# Patient Record
Sex: Female | Born: 1955 | Race: White | Hispanic: No | Marital: Married
Health system: Southern US, Community
[De-identification: ages and names within clinical notes are randomized; demographics above are authoritative.]

---

## 2013-08-19 ENCOUNTER — Observation Stay: Payer: Self-pay | Admitting: Internal Medicine

## 2013-08-19 LAB — URINALYSIS, COMPLETE
Bacteria: NEGATIVE
Blood: NEGATIVE
Glucose,UR: 1000 mg/dL (ref 0–75)
Ketone: NEGATIVE
Leukocyte Esterase: NEGATIVE
Ph: 5 (ref 4.5–8.0)
Protein: NEGATIVE
RBC,UR: NONE SEEN /HPF (ref 0–5)

## 2013-08-19 LAB — COMPREHENSIVE METABOLIC PANEL
Alkaline Phosphatase: 115 U/L (ref 50–136)
BUN: 15 mg/dL (ref 7–18)
Bilirubin,Total: 0.4 mg/dL (ref 0.2–1.0)
Calcium, Total: 9.8 mg/dL (ref 8.5–10.1)
Chloride: 104 mmol/L (ref 98–107)
Co2: 28 mmol/L (ref 21–32)
EGFR (Non-African Amer.): >60
Osmolality: 277 (ref 275–301)
Potassium: 4.3 mmol/L (ref 3.5–5.1)

## 2013-08-19 LAB — CBC
HCT: 42.1 % (ref 35.0–47.0)
HGB: 14.1 g/dL (ref 12.0–16.0)
MCH: 26.8 pg (ref 26.0–34.0)
RBC: 5.23 10*6/uL — ABNORMAL HIGH (ref 3.80–5.20)
RDW: 14.8 % — ABNORMAL HIGH (ref 11.5–14.5)
WBC: 7 10*3/uL (ref 3.6–11.0)

## 2013-08-19 LAB — TROPONIN I: Troponin-I: <0.02 ng/mL

## 2015-03-08 NOTE — Discharge Summary (Signed)
PATIENT NAME:  Sharon FramesCROSS, Nneka MR#:  [id number] DATE OF BIRTH:  Nov 28, 1955  DATE OF ADMISSION:  08/19/2013 DATE OF DISCHARGE:  08/19/2013   ADMISSION DIAGNOSIS: Vertigo.   DISCHARGE DIAGNOSES:  1. Vertigo, likely benign paroxysmal positional vertigo.  2. History of diabetes. 3. History of hyperlipidemia.   CONSULTATIONS: None.   IMAGING:  1. CT of the head showed no acute intracranial hemorrhage or CVA.  2. MRI of the brain showed no acute CVA.  3. Ultrasound of carotids showed no hemodynamically significant stenosis.   HOSPITAL COURSE: This is a 59 year old female who is visiting [facility], who came into the ER with vertigo. For further details, please refer to Dr. Rob HickmanGouru's H and P.   1. Vertigo. The patient was admitted to rule out a TIA. Her CT and MRI of the head were both negative. She was placed on meclizine, which should help with the vertigo. She did not need an echocardiogram as this was not related to a CVA. Her carotid Dopplers showed no hemodynamic significant stenosis.  2. The patient will continue with meclizine, and PT consultation was obtained.  3. Diabetes. The patient will continue her outpatient medications.  4. Hyperlipidemia, on Zocor.   DISCHARGE MEDICATIONS:  1. Metformin 1000 mg b.i.d.  2. Cymbalta 60 mg daily.  3. Cymbalta 30 mg at bedtime.  4. Zocor 40 mg at bedtime.  5. Zegerid daily.  6. Allergy medicine daily.  7. Meclizine 25 mg t.i.d. p.r.n. vertigo.   DISCHARGE DIET: ADA diet.   DISCHARGE ACTIVITY: As tolerated.   DISCHARGE FOLLOWUP: The patient can follow up in [place] with her primary care physician.   TIME SPENT: 35 minutes.   The patient is medically stable for discharge.   ____________________________ Margerie Fraiser P. Juliene PinaMody, MD spm:OSi D: 08/19/2013 12:36:34 ET T: 08/19/2013 13:26:24 ET JOB#: [id number]  cc: Cosmo Tetreault P. Juliene PinaMody, MD, <Dictator> Janyth ContesSITAL P Alzena Gerber MD ELECTRONICALLY SIGNED 08/19/2013 21:49

## 2015-03-08 NOTE — H&P (Signed)
PATIENT NAME:  Sharon FramesCROSS, Verlee MR#:  [id number] DATE OF BIRTH:  1956-04-12  DATE OF ADMISSION:  08/19/2013  PRIMARY CARE PHYSICIAN: Nonlocal.   REFERRING PHYSICIAN: Darien Ramusavid W. Kaminski, MD  CHIEF COMPLAINT: Right-sided weakness with headache and vertigo.   HISTORY OF PRESENT ILLNESS: The patient is a 59 year old Caucasian female with past medical history of TIA, diabetes mellitus and hyperlipidemia, who is presenting to the ER with a chief complaint of room spinning around her associated with headache and right-sided weakness. The patient is reporting that she fell asleep last night, and at around 2:00 a.m., she woke up from sleep because of vertigo. The patient felt like the room was spinning around her, associated with headache. Also, she has developed right-sided weakness. Denies any difficulty with swallowing or speech. She had a history of TIA in the past in April 2014. At that time, she does not remember getting any MRI of the brain. Here in the ER, CAT scan of the head is negative for any acute findings. The patient was given Phenergan by the ER physician, which temporarily helped with vertigo, but again, she started developing the room spinning around her sensation. She denies any similar complaints in the past. She also is complaining of decreased touch sensation on the right-hand side. Husband is at bedside. Denies any chest pain, shortness of breath. The patient used to have a history of hypertension, but as her blood pressures were running on the lower side, her blood pressure medications were discontinued.   PAST MEDICAL HISTORY: TIA, diabetes mellitus, hyperlipidemia, GERD, seasonal allergies.   PAST SURGICAL HISTORY: Hysterectomy.   ALLERGIES: No known drug allergies.   PSYCHOSOCIAL HISTORY: Lives at home with husband. She used to smoke, but quit smoking. Occasional drinking of alcohol. Denies any illicit drug usage.   FAMILY HISTORY: Both mom and dad have history of diabetes mellitus,  and mom had a history of stroke.   REVIEW OF SYSTEMS:  CONSTITUTIONAL: Denies any fever, fatigue. No weight loss or weight gain.  EYES: Denies any blurry vision or glaucoma.  ENT: Denies epistaxis or discharge.  Respiration: Denies cough, COPD.  CARDIOVASCULAR: Denies any chest pain or palpitations.  GASTROINTESTINAL: Denies nausea, vomiting, diarrhea.  GENITOURINARY: No dysuria or hematuria.  GYNECOLOGIC AND BREASTS: Denies breast mass or vaginal discharge.  ENDOCRINE: Denies polyuria, nocturia, thyroid problems.  HEMATOLOGIC AND LYMPHATIC: Denies anemia, easy bruising, bleeding.  INTEGUMENTARY: No acne, rash, lesions.  MUSCULOSKELETAL: Denies any joint pain in the neck, back, shoulder. NEUROLOGIC: Has a past history of TIA. Denies any numbness or epilepsy.  PSYCHIATRIC: Denies any ADD, OCD.   PHYSICAL EXAMINATION:  VITAL SIGNS: Temperature at 97.4, pulse 76, respirations 16, blood pressure 120/60, pulse oximetry 100% on room air.  GENERAL APPEARANCE: Not under acute distress. Moderately built and nourished.  HEENT: Normocephalic, atraumatic. Pupils are equally reacting to light and accommodation. No scleral icterus. No conjunctival injection. No sinus tenderness. No postnasal drip. Moist mucous membranes.  NECK: Supple. No JVD. No thyromegaly. Range of motion of the neck is intact. No meningeal signs.  LUNGS: Clear to auscultation bilaterally. No accessory muscle usage. No anterior chest wall tenderness on palpation.  CARDIAC: S1, S2 normal. Regular rate and rhythm. No murmurs.  GASTROINTESTINAL: Soft. Bowel sounds are positive in all 4 quadrants. Nontender, nondistended. No hepatosplenomegaly.  NEUROLOGIC: Awake, alert, oriented x3. Cranial nerves II through XII are grossly intact. Sensory: Decreased touch sensation on the right upper and lower extremity as well as on the right side of the  face. Motor: Right upper extremity and lower extremity strength is 4 out of 5. Left upper extremity  and lower extremity motor and sensory are intact. Reflexes are 2+. Gait is not evaluated as the patient is complaining of severe vertigo and uncomfortable getting out of the bed.  EXTREMITIES: No edema. No cyanosis. No clubbing.  SKIN: Warm to touch. Normal turgor. No rashes. No lesions.  VASCULAR: Good dorsalis pedis and posterior tibial pulses.  PSYCHIATRIC: Normal mood and affect.   LABORATORY AND IMAGING STUDIES: A 12-lead EKG: Normal sinus rhythm. Normal PR and QRS intervals. CAT scan of the head: Normal noncontrast scan of the brain. LFTs are normal except total protein which is elevated at 8.3. CBC is normal. Urinalysis: Straw-colored, clear in appearance, glucose 1000 mg/dL, ketones negative, nitrite negative, leukocyte esterase negative. BMP is normal except for glucose which is at 132 and anion gap low at 5.   ASSESSMENT AND PLAN: A 59 year old Caucasian female with past medical history of transient ischemic attack, who is presenting to the ER with a chief complaint of vertigo and right-sided weakness. Will be admitted with the following assessment and plan:   1. Transient ischemic attack versus stroke with right-sided weakness.  Will admit her to telemetry under observation status with stroke workup, with MRI of the brain, carotid Dopplers and 2-D echocardiogram. Will provide her aspirin 325 mg. Continue home medication, statin.  PT evaluation for dizziness and possible head tilt test regarding vertigo.  2. Vertigo, probably benign paroxysmal positional vertigo. Differential diagnosis: Labyrinthitis versus Meniere's disease. Will provide her meclizine p.r.n. Outpatient followup with ENT is recommended if there is no improvement in vertigo.  3. Diabetes mellitus. The patient will be on insulin sliding scale and resume metformin.  4. Hyperlipidemia. Continue statin and check fasting lipid panel.  5. Past history of transient ischemic attack. The patient will be on aspirin 325 mg at this  time. 6. The patient will be provided with gastrointestinal and deep vein thrombosis prophylaxis.   Diagnosis and plan of care were discussed in detail with the patient. She is aware of the plan.   CODE STATUS: She is full code. Husband is the medical power of attorney.   TOTAL TIME SPENT ON ADMISSION: 45 minutes.   ____________________________ Ramonita Lab, MD ag:OSi D: 08/19/2013 07:32:44 ET T: 08/19/2013 08:00:55 ET JOB#: [id number]  cc: Ramonita Lab, MD, <Dictator> Ramonita Lab MD ELECTRONICALLY SIGNED 08/21/2013 1:02

## 2015-05-23 IMAGING — CT CT HEAD WITHOUT CONTRAST
2 series · 16 of 30 positions shown, 20 images · non-contrast
Comparison: none

REASON FOR EXAM: dizziness, weakness, altered
COMMENTS:   May transport without cardiac monitor

[Series 2: without · axial · non-contrast · 0.40mm/px · z∈[-126,-1]mm · 13 of 31 slices shown, 17 images]
[im 3/31  brain]
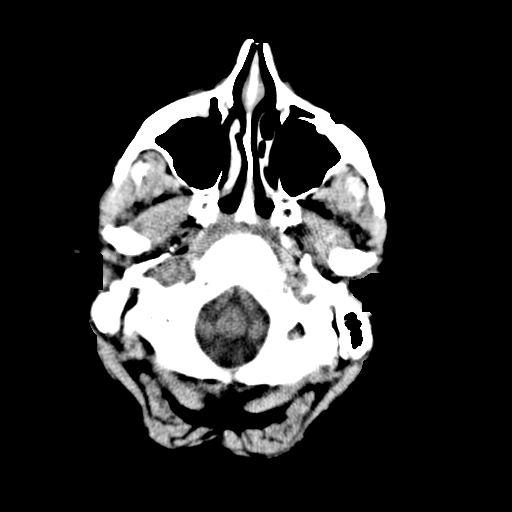
[im 3/31  bone]
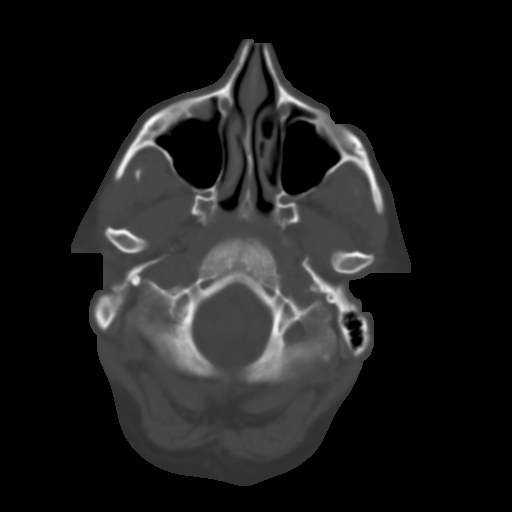
[im 5/31  brain]
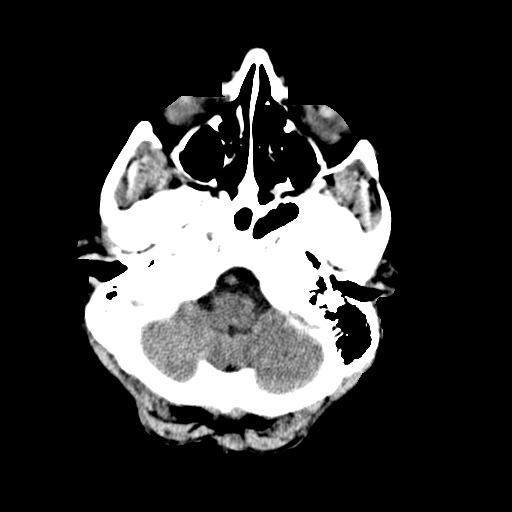
[im 7/31  brain]
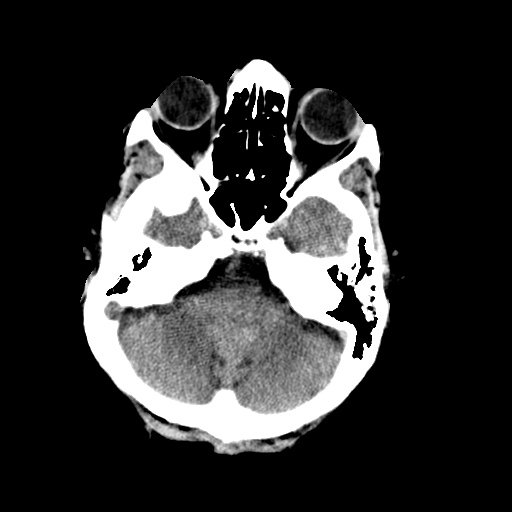
[im 9/31  brain]
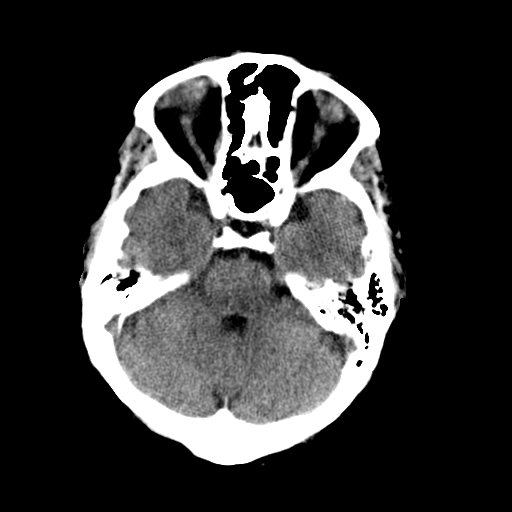
[im 11/31  brain]
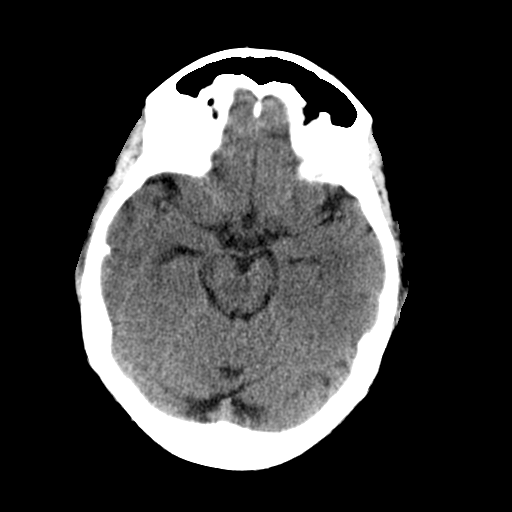
[im 11/31  bone]
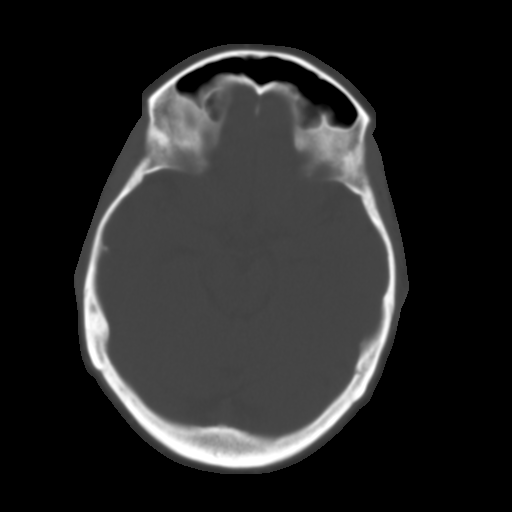
[im 13/31  brain]
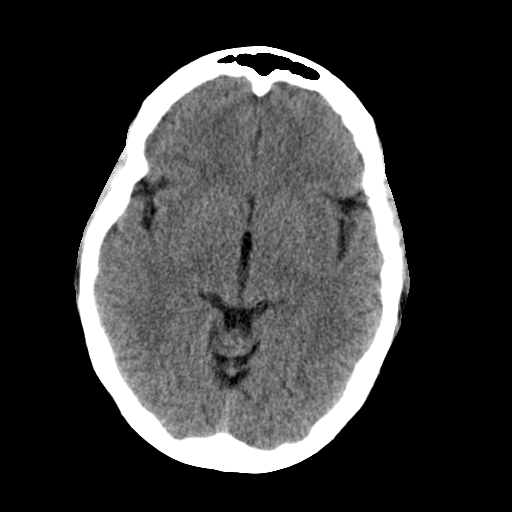
[im 16/31  brain]
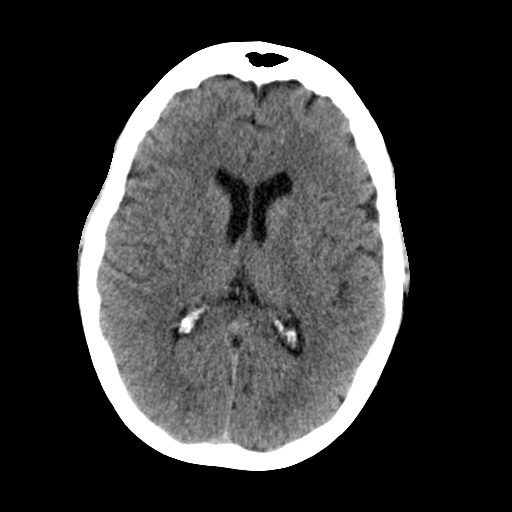
[im 18/31  brain]
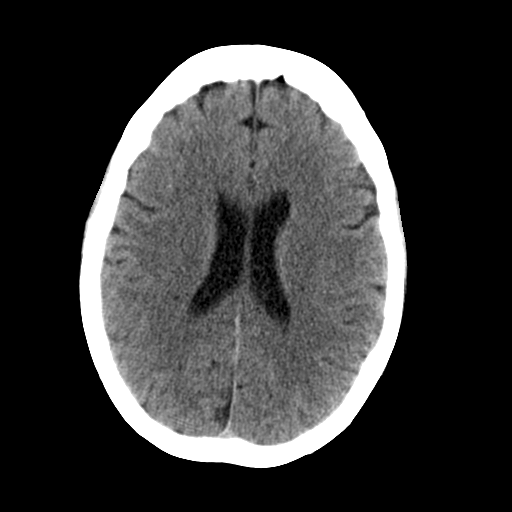
[im 20/31  brain]
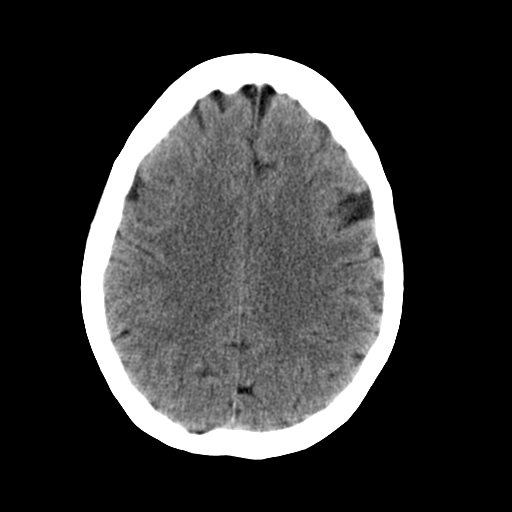
[im 20/31  bone]
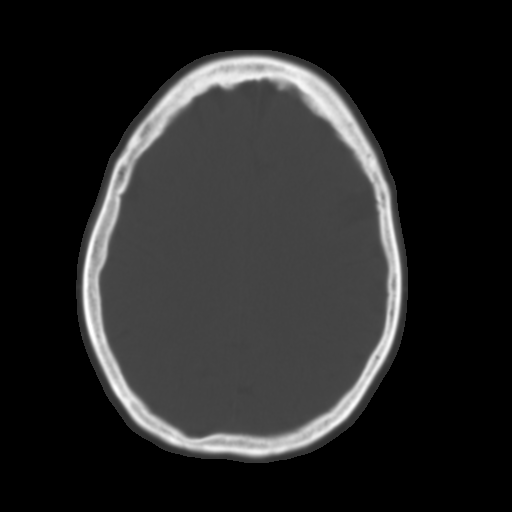
[im 22/31  brain]
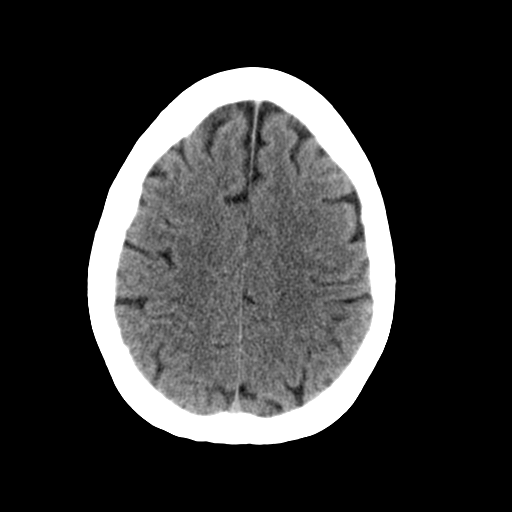
[im 24/31  brain]
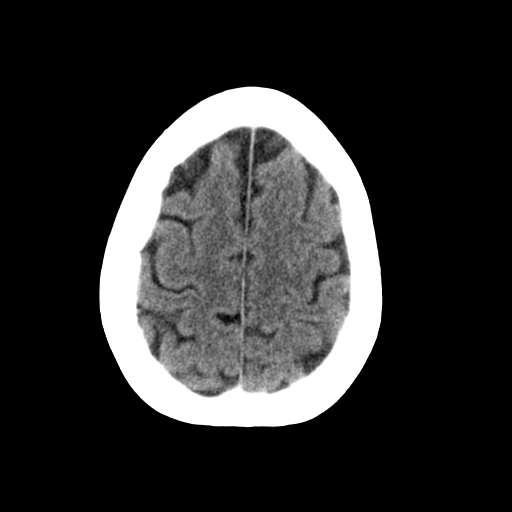
[im 26/31  brain]
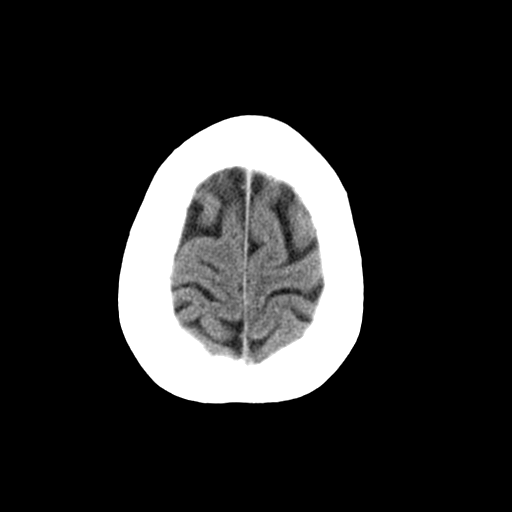
[im 28/31  brain]
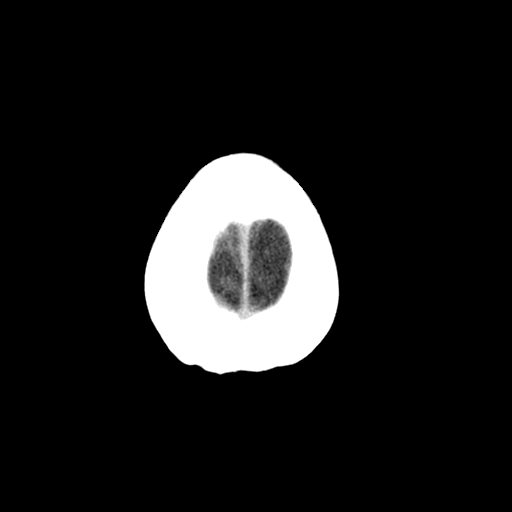
[im 28/31  bone]
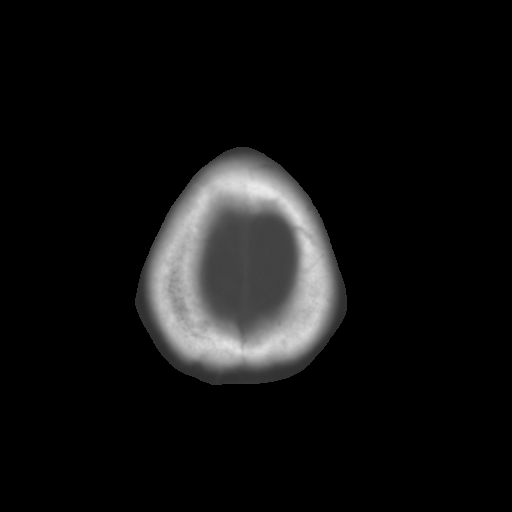

[Series 3: bone · axial · 0.40mm/px · z∈[-126,-86]mm · 3 of 31 slices shown]
[im 3/31  bone]
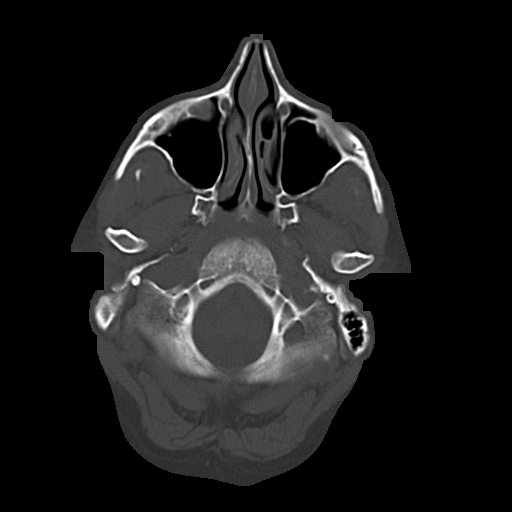
[im 7/31  bone]
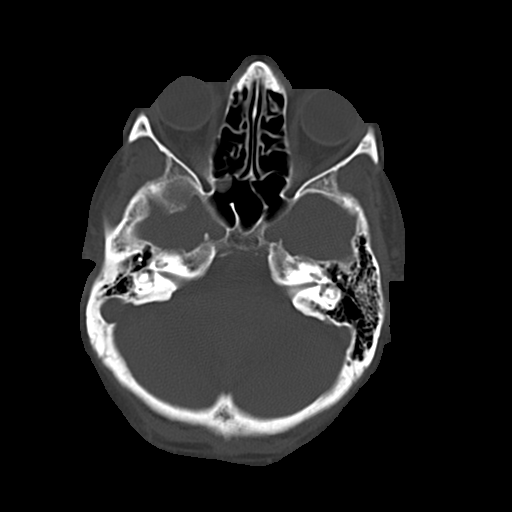
[im 11/31  bone]
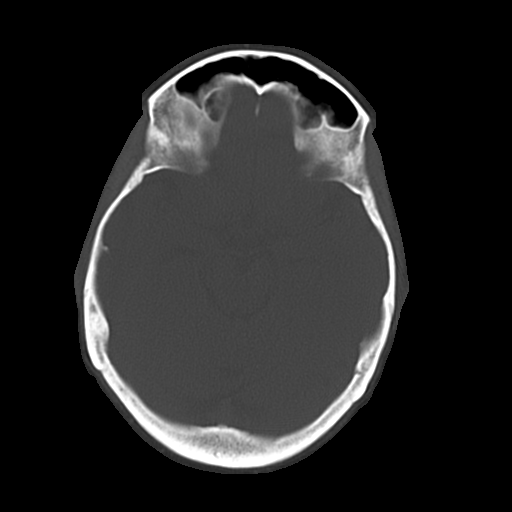

[16 of 30 positions shown; findings below may reference images not displayed]

PROCEDURE:     CT  - CT HEAD WITHOUT CONTRAST  - August 19, 2013  [DATE]

RESULT:     Axial noncontrast CT scanning was performed through the brain
with reconstructions at 5 mm intervals and slice thicknesses.

The ventricles are normal in size and position. There is no intracranial
hemorrhage nor intracranial mass effect Shikho there is no evidence of an
evolving ischemic event. The cerebellum and brainstem are normal in density.

At bone window settings the observed portions of the paranasal sinuses and
mastoid air cells are clear. There is no evidence of an acute skull fracture.
IMPRESSION: Normal noncontrast the scan of the brain.

A preliminary report was sent to the [HOSPITAL] the conclusion
of the study.

[REDACTED]

## 2015-05-23 IMAGING — US US CAROTID DUPLEX BILAT
1 series · 14 of 24 positions shown · non-contrast
Comparison: none

REASON FOR EXAM: cva
COMMENTS:

[Series 1: us carotid duplex bilat · 14 of 53 slices shown]
[im 1/53]
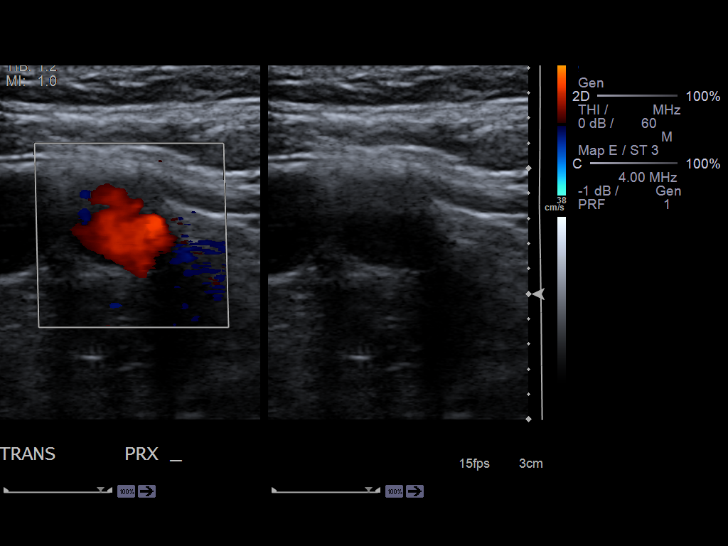
[im 5/53]
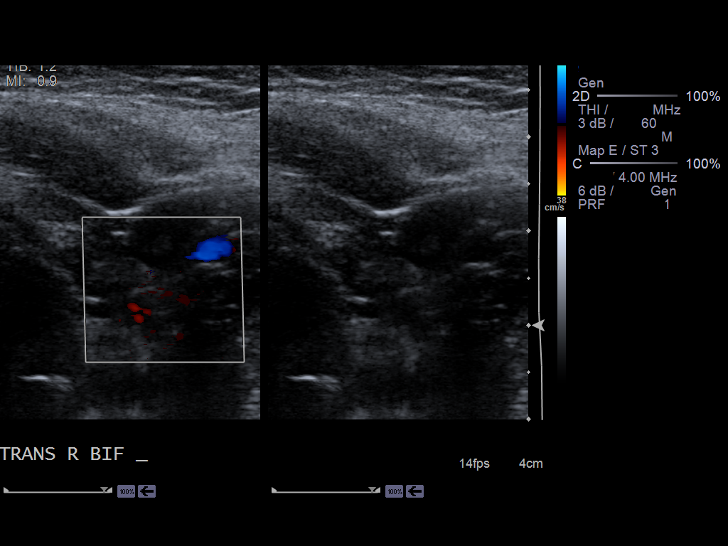
[im 10/53]
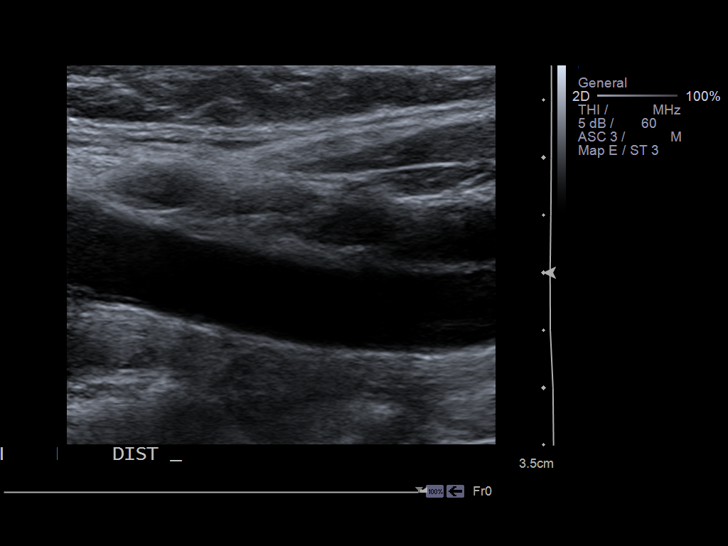
[im 14/53]
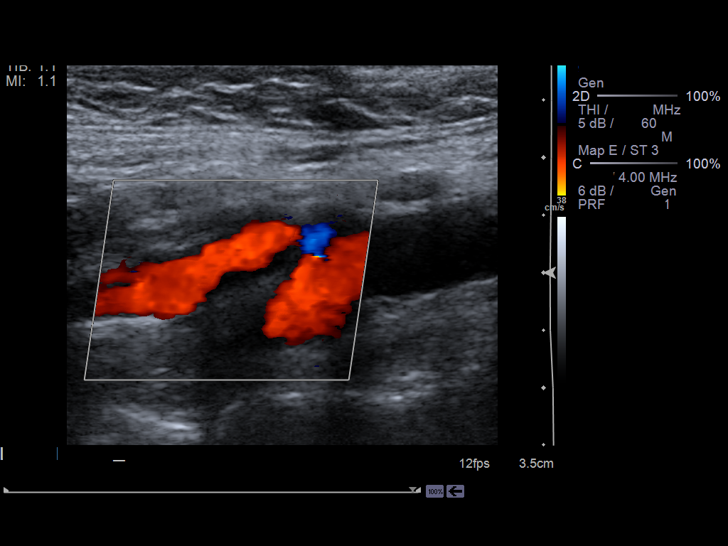
[im 16/53]
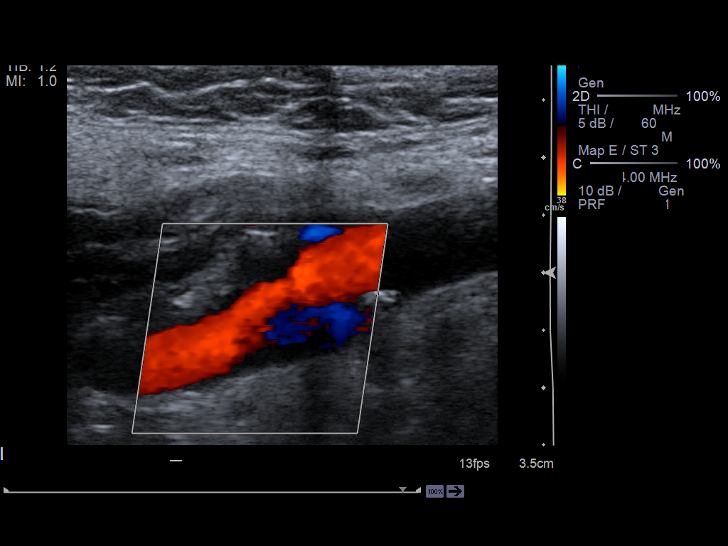
[im 21/53]
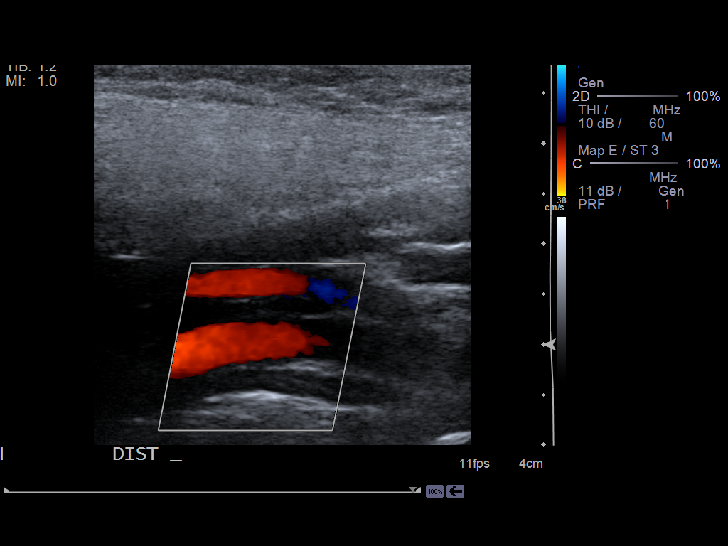
[im 25/53]
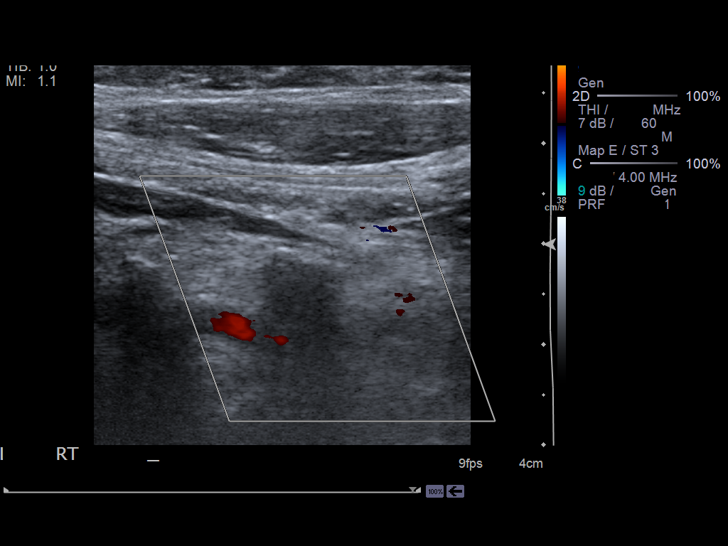
[im 28/53]
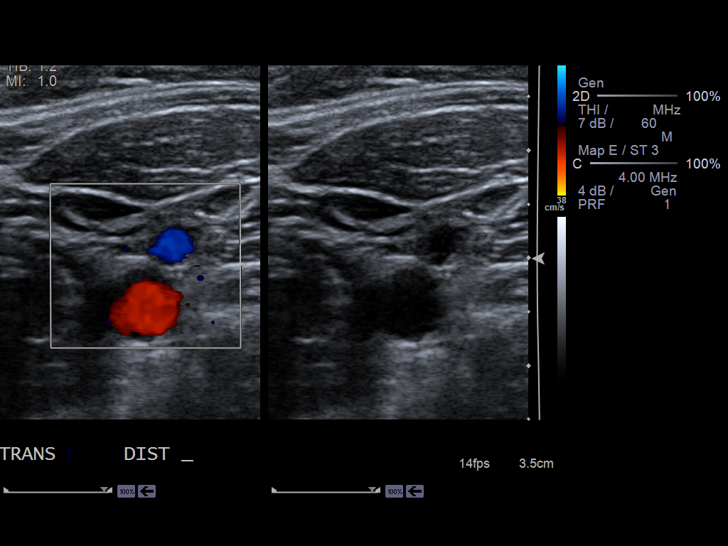
[im 32/53]
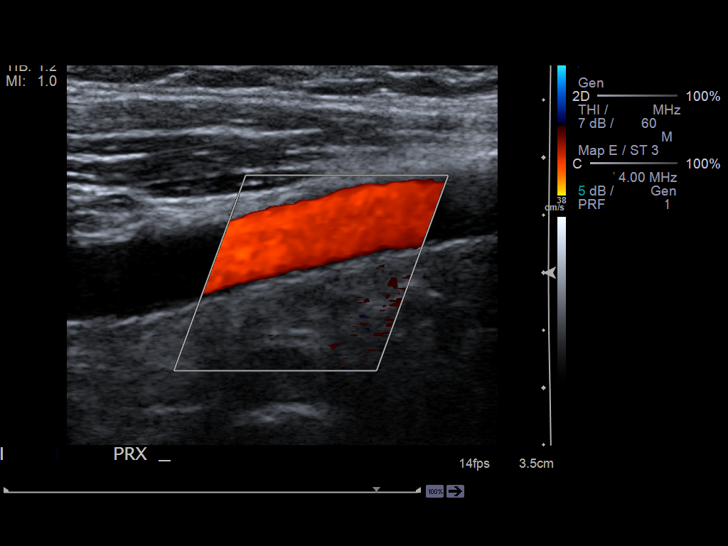
[im 37/53]
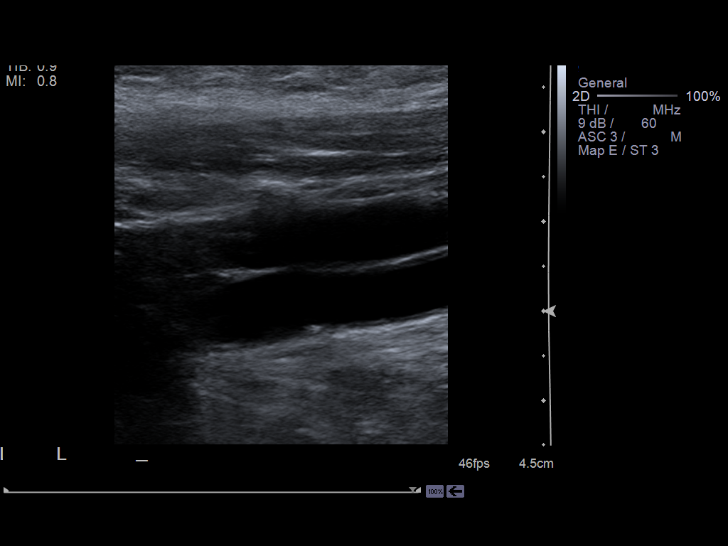
[im 41/53]
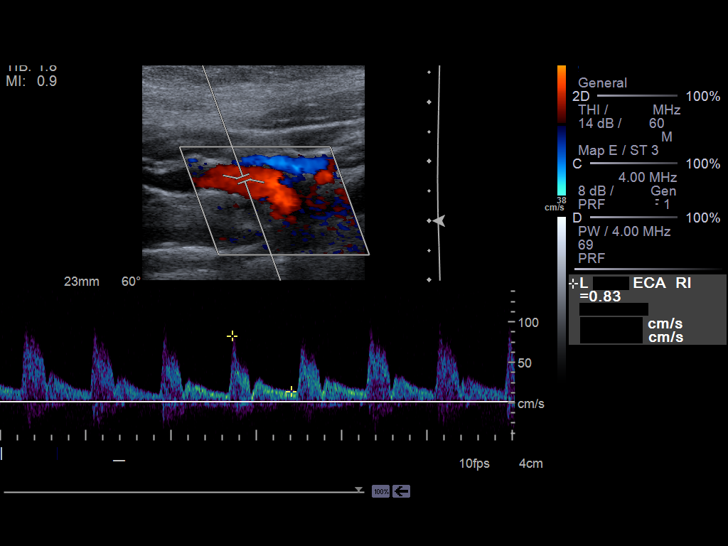
[im 43/53]
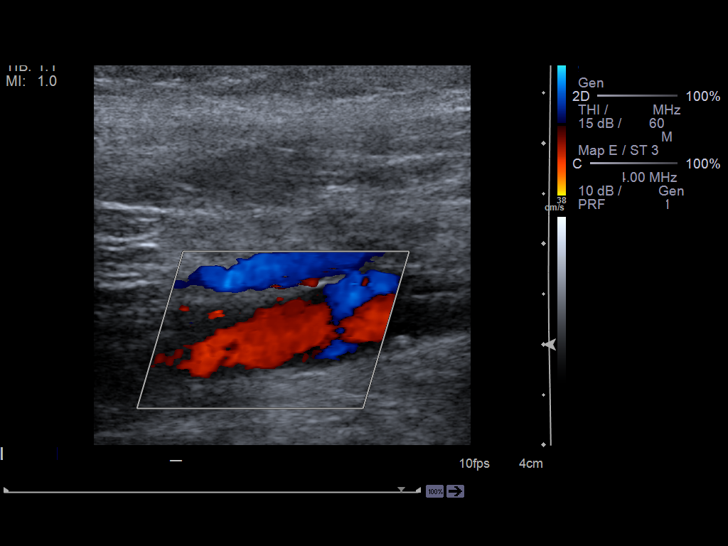
[im 48/53]
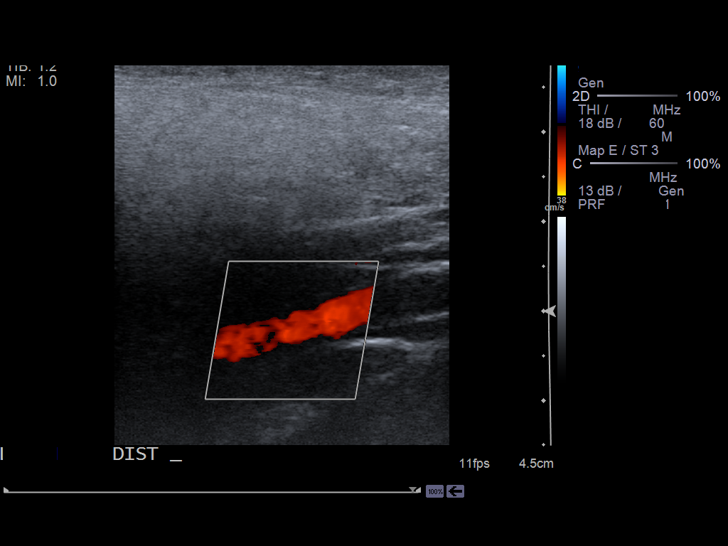
[im 53/53]
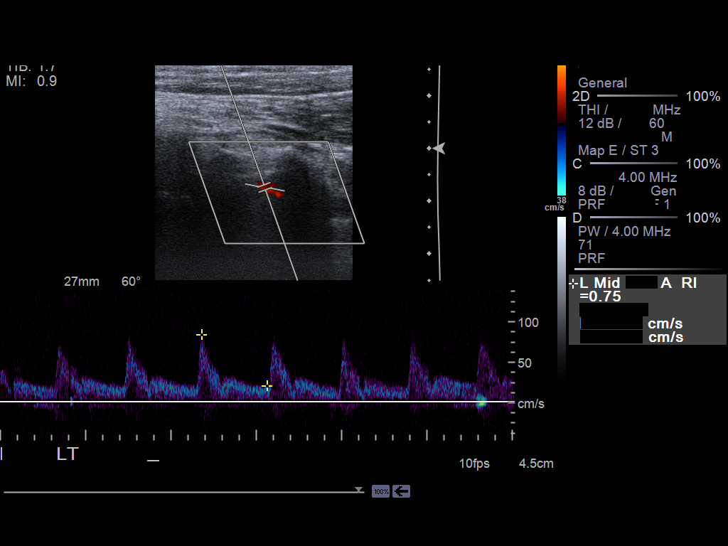

[14 of 24 positions shown; findings below may reference images not displayed]

PROCEDURE:     US  - US CAROTID DOPPLER BILATERAL  - August 19, 2013  [DATE]

RESULT:     Grayscale and color flow Doppler techniques were employed to
evaluate the cervical carotid arteries.

On the right there is a small amount of calcified plaque at the carotid
bulb. On the left at approximately the same level there is an even smaller
amount of calcified plaque. The waveform patterns and color flow images do
not suggest significant turbulence. On the right the peak internal carotid
systolic velocity measured 76 cm/sec and the peak common carotid velocity
measured 93 cm/sec corresponding to a normal ratio of 0.8. On the left the
peak internal carotid systolic velocity measured 62 cm/sec and the peak
common carotid velocity measured 117 cm/sec corresponding to a normal ratio
of 0.5. The vertebral arteries are normal in flow direction bilaterally.
IMPRESSION: There is no evidence of a hemodynamically significant
carotid stenosis.

[REDACTED]
